# Patient Record
Sex: Male | Born: 1960 | Race: White | Hispanic: No | Marital: Single | Smoking: Never smoker
Health system: Southern US, Community
[De-identification: ages and names within clinical notes are randomized; demographics above are authoritative.]

## PROBLEM LIST (undated history)

## (undated) DIAGNOSIS — J45909 Unspecified asthma, uncomplicated: Secondary | ICD-10-CM

## (undated) HISTORY — DX: Unspecified asthma, uncomplicated: J45.909

---

## 1997-03-05 HISTORY — PX: BACK SURGERY: SHX140

## 1997-04-05 HISTORY — PX: LUNG REMOVAL, PARTIAL: SHX233

## 2000-10-15 ENCOUNTER — Encounter (INDEPENDENT_AMBULATORY_CARE_PROVIDER_SITE_OTHER): Payer: Self-pay | Admitting: *Deleted

## 2000-10-16 ENCOUNTER — Observation Stay (HOSPITAL_COMMUNITY): Admission: EM | Admit: 2000-10-16 | Discharge: 2000-10-17 | Payer: Self-pay

## 2001-04-05 HISTORY — PX: BACK SURGERY: SHX140

## 2002-10-06 HISTORY — PX: APPENDECTOMY: SHX54

## 2008-12-18 ENCOUNTER — Encounter: Admission: RE | Admit: 2008-12-18 | Discharge: 2008-12-18 | Payer: Self-pay | Admitting: Emergency Medicine

## 2011-01-21 NOTE — H&P (Signed)
[place]. [facility]  Patient:    Mason Rodriguez, Mason Rodriguez                       MRN: [id number] Adm. Date:  10/16/00 Attending:  Jimmye Norman, M.D.                         History and Physical  IDENTIFICATION AND CHIEF COMPLAINT:  The patient is a 50 year old gentleman with right lower quadrant pain, nausea, no vomiting, leukocytosis and CT diagnosed acute appendicitis.  HISTORY OF PRESENT ILLNESS:  The patient started getting ill yesterday morning with abdominal pain in the epigastrium and mid abdomen which slowly migrated to the right lower quadrant.  It was associated with some nausea.  No vomiting.  He had no fever or chills at home.  He came to the emergency room where his white count was noted to be 16,000.  A CT scan was done, in spite of having localized tenderness in the right lower quadrant in a young male and it showed acute appendicitis.  A surgical consultation was obtained.  PAST MEDICAL HISTORY:  Unremarkable.  He has had two previous back surgeries. He has no known drug allergies.  MEDICATIONS:  Occasional Vioxx.  REVIEW OF SYSTEMS:  He had no shortness of breath, chest pain, urinary symptoms, bladder symptoms, bowel problems.  His last bowel movement was yesterday.  His last meal was yesterday evening.  PHYSICAL EXAMINATION:  VITAL SIGNS:  He is afebrile.  His other vital signs are stable.  HEENT:  He is normocephalic, atraumatic and anicteric.  NECK:  Supple.  CHEST:  Clear.  CARDIAC:  Regular rate and rhythm.  ABDOMEN:  Soft and tender in the right lower quadrant with hypoactive bowel sounds no rebound or guarding.  RECTAL:  Examination no palpable mass.  Guaiac negative.  LABORATORY:  White count 16,700, hematocrit is over 40%.  Platelet count is normal.  Urinalysis is negative.  The 12 lead EKG is unremarkable.  IMPRESSION:  Likely acute appendicitis in an otherwise healthy 50 year old male.  PLAN:  Perform laparoscopic  possible open appendectomy if it should be ruptured.  No evidence of rupture on CT scan. DD:  10/16/00 TD:  10/16/00 Job: [id number]

## 2011-01-21 NOTE — Op Note (Signed)
[place]  Patient:    Mason Rodriguez, Mason Rodriguez                       MRN: [id number] Proc. Date: 10/16/00 Adm. Date:  10/16/00 Attending:  Jimmye Norman, M.D.                           Operative Report  PREOPERATIVE DIAGNOSIS:  Acute appendicitis.  POSTOPERATIVE DIAGNOSIS:  Acute appendicitis without perforation or abscess formation.  OPERATION PERFORMED:  Laparoscopic appendectomy.  SURGEON:  Jimmye Norman, M.D.  ASSISTANT:  None.  ANESTHESIA:  General endotracheal.  ESTIMATED BLOOD LOSS:  Less than 20 cc.  COMPLICATIONS:  None.  CONDITION:  Stable.  INDICATIONS FOR PROCEDURE:  The patient is an otherwise healthy 50 year old male with abdominal pain starting in the epigastrium and midabdomen, migrating to the right lower quadrant.  Clinically significant for acute appendicitis with a white count of 16,700.  He did receive a CT scan which confirmed the clinical impression of acute appendicitis.  OPERATIVE FINDINGS:  The patient had an acutely inflamed retrocecal appendix which was not perforated.  DESCRIPTION OF PROCEDURE:  The patient was taken to the operating room and placed on the table in the supine position.  After an adequate general anesthetic was administered, he was prepped and draped in the usual sterile manner exposing the midline and the right side of the abdomen.  Initially, a superumbilical curvilinear incision was made using a #11 blade and taken down to the midline fascia through which a ____________ was passed into the peritoneal cavity while tenting up on the anterior abdominal wall using sharp towel clamps.  The Veress needle was confirmed to be in position using the saline test and subsequently carbon dioxide insufflation was instilled through the Veress needle into the peritoneal cavity up to a maximal intra-abdominal pressure of 15 mmHg.  Once this was done, the 10-11 mm trocar and cannula was passed through the  superumbilical fascia into the peritoneal cavity and confirmed to be in adequate position using laparoscope with attached camera and light source.  The patient was placed in Trendelenburg position.  The left side was tilted down.  The acutely inflamed appendix could be identified.  We placed a right upper quadrant 5 mm cannula and a superpubic 11-12 mm cannula into the peritoneal cavity under direct vision.  With that in place, we subsequently were able to perform the operation.  The peritoneum attaching the appendix laterally along with the inflammatory adhesions were taken down bluntly.  We were able to suspend the acutely inflamed appendix which was not perforated and dissect between the mesoappendix and the base of the appendix at the cecum.  It was through this window that a Endo GIA with 3.5 mm staples was passed along the base of the appendix and stapled across detaching it from the base of the cecum.  We subsequently passed a 2.5 mm stapler across the medial appendix detaching it with minimal bleeding.  There was some left that needed to be cauterized along the mesentery and subsequently no further bleeding.  Once the appendix was completely detached, we brought it out through the cannula at the superpubic site without contaminating the subcu.  We subsequently irrigated with a half liter of warm saline and then we allowed all gas to escape through the cannulas, removed the instruments and then closed the abdomen.  The superumbilical and the superpubic fascia were closed  using 0 Vicryl.  At the superumbilical site a figure-of-eight stitch was used and a simple stitch was used at the superpubic site.  0.25% Marcaine with epinephrine was injected at all the incision sites and the skin was closed using a running subcuticular 4-0 Vicryl.  Sponge, needle and instrument counts were correct at conclusion of the case.  Sterile dressings were applied to all wounds. DD:  10/16/00 TD:   10/16/00 Job: [id number]

## 2012-03-29 ENCOUNTER — Other Ambulatory Visit: Payer: Self-pay | Admitting: Family Medicine

## 2012-03-29 DIAGNOSIS — R109 Unspecified abdominal pain: Secondary | ICD-10-CM

## 2012-04-02 ENCOUNTER — Ambulatory Visit
Admission: RE | Admit: 2012-04-02 | Discharge: 2012-04-02 | Disposition: A | Discharge status: Home / Self Care | Payer: BC Managed Care – PPO | Source: Ambulatory Visit | Attending: Family Medicine | Admitting: Family Medicine

## 2012-04-02 DIAGNOSIS — R109 Unspecified abdominal pain: Secondary | ICD-10-CM

## 2012-04-09 ENCOUNTER — Other Ambulatory Visit: Payer: Self-pay

## 2013-02-13 ENCOUNTER — Other Ambulatory Visit: Payer: Self-pay | Admitting: Dermatology

## 2015-12-14 DIAGNOSIS — L723 Sebaceous cyst: Secondary | ICD-10-CM | POA: Diagnosis not present

## 2015-12-14 DIAGNOSIS — L821 Other seborrheic keratosis: Secondary | ICD-10-CM | POA: Diagnosis not present

## 2015-12-14 DIAGNOSIS — Z85828 Personal history of other malignant neoplasm of skin: Secondary | ICD-10-CM | POA: Diagnosis not present

## 2015-12-14 DIAGNOSIS — L438 Other lichen planus: Secondary | ICD-10-CM | POA: Diagnosis not present

## 2015-12-14 DIAGNOSIS — L570 Actinic keratosis: Secondary | ICD-10-CM | POA: Diagnosis not present

## 2016-01-14 DIAGNOSIS — Z85828 Personal history of other malignant neoplasm of skin: Secondary | ICD-10-CM | POA: Diagnosis not present

## 2016-01-14 DIAGNOSIS — C44519 Basal cell carcinoma of skin of other part of trunk: Secondary | ICD-10-CM | POA: Diagnosis not present

## 2016-01-14 DIAGNOSIS — C44619 Basal cell carcinoma of skin of left upper limb, including shoulder: Secondary | ICD-10-CM | POA: Diagnosis not present

## 2016-01-14 DIAGNOSIS — L821 Other seborrheic keratosis: Secondary | ICD-10-CM | POA: Diagnosis not present

## 2016-01-14 DIAGNOSIS — L918 Other hypertrophic disorders of the skin: Secondary | ICD-10-CM | POA: Diagnosis not present

## 2016-01-14 DIAGNOSIS — D225 Melanocytic nevi of trunk: Secondary | ICD-10-CM | POA: Diagnosis not present

## 2016-07-11 DIAGNOSIS — E7800 Pure hypercholesterolemia, unspecified: Secondary | ICD-10-CM | POA: Diagnosis not present

## 2016-07-11 DIAGNOSIS — M62830 Muscle spasm of back: Secondary | ICD-10-CM | POA: Diagnosis not present

## 2016-07-11 DIAGNOSIS — J4520 Mild intermittent asthma, uncomplicated: Secondary | ICD-10-CM | POA: Diagnosis not present

## 2016-07-11 DIAGNOSIS — Z0000 Encounter for general adult medical examination without abnormal findings: Secondary | ICD-10-CM | POA: Diagnosis not present

## 2016-07-11 DIAGNOSIS — I10 Essential (primary) hypertension: Secondary | ICD-10-CM | POA: Diagnosis not present

## 2016-07-11 DIAGNOSIS — Z125 Encounter for screening for malignant neoplasm of prostate: Secondary | ICD-10-CM | POA: Diagnosis not present

## 2016-08-05 DIAGNOSIS — J4520 Mild intermittent asthma, uncomplicated: Secondary | ICD-10-CM | POA: Diagnosis not present

## 2016-08-05 DIAGNOSIS — J069 Acute upper respiratory infection, unspecified: Secondary | ICD-10-CM | POA: Diagnosis not present

## 2016-09-07 ENCOUNTER — Encounter (INDEPENDENT_AMBULATORY_CARE_PROVIDER_SITE_OTHER): Payer: Self-pay

## 2016-09-07 ENCOUNTER — Ambulatory Visit (INDEPENDENT_AMBULATORY_CARE_PROVIDER_SITE_OTHER): Payer: BLUE CROSS/BLUE SHIELD | Admitting: Allergy & Immunology

## 2016-09-07 ENCOUNTER — Encounter: Payer: Self-pay | Admitting: Allergy & Immunology

## 2016-09-07 VITALS — BP 138/88 | HR 61 | Temp 97.8°F | Resp 18 | Ht 69.75 in | Wt 213.2 lb

## 2016-09-07 DIAGNOSIS — J3089 Other allergic rhinitis: Secondary | ICD-10-CM

## 2016-09-07 DIAGNOSIS — T781XXD Other adverse food reactions, not elsewhere classified, subsequent encounter: Secondary | ICD-10-CM

## 2016-09-07 DIAGNOSIS — J4530 Mild persistent asthma, uncomplicated: Secondary | ICD-10-CM | POA: Diagnosis not present

## 2016-09-07 NOTE — Progress Notes (Addendum)
NEW PATIENT  Date of Service/Encounter:  09/07/16   Assessment:   Mild persistent asthma, uncomplicated  Chronic nonseasonal allergic rhinitis due to pollen  Adverse food reactions   Asthma Reportables:  Severity: mild persistent  Risk: low Control: not well controlled  Seasonal Influenza Vaccine: yes    Plan/Recommendations:    1. Mild persistent asthma, uncomplicated - Lung function was normal today with reversibility. - Despite the fact that the reversibility was not significant, it would have been difficult to get enough of an improvement from his baseline normal levels. - Given the reversibility and the somewhat vague symptoms, I feel that it would be worthwhile to trial a dose of ICS to see if there is improvement. - Sample of Arnuity one inhalation daily provided.  - I asked Mason Rodriguez to call us to let us know if this helps your respiratory symptoms and we can send it in.   2. Chronic rhinitis - Testing was positive to grasses, weeds, trees, molds, cats, horses, dust mite, and cockroach - Mason Rodriguez will give [place] a call when he makes decision about immunotherapy. - Start Flonase 2 sprays per nostril daily. - Use Xyzal 5 mg daily as needed for breakthrough symptoms. - Samples provided.   3. Adverse food reactions - Testing was negative today. - There is an excellent negative predictive value, therefore these are likely true negatives. - Deferred blood testing since the symptoms were rather vague.   4. Return in about 3 months (around 12/06/2016).   Subjective:   Mason Rodriguez is a 56 y.o. male presenting today for evaluation of  Chief Complaint  Patient presents with  . New Evaluation    allergy induced asthma  . Allergy Testing    has not had any since he as 18  . Asthma    having off and on problems since November (wheezing, hard time breathing    Mason Rodriguez has a history of the following: Patient Active Problem List   Diagnosis Date Noted  . Chronic nonseasonal allergic rhinitis due to pollen 09/07/2016  . Mild persistent asthma, uncomplicated 09/07/2016    History obtained from: chart review and patient.  Mason Rodriguez was referred by Darrow Bussing, MD.     Mason Rodriguez is a 56 y.o. male presenting for an allergy and asthma evaluation. 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 He was first diagnosed when he was around age 15, including two hospitalizations. He was never intubated. He was on asthma medications when he was a kid although he cannot remember the names of the medications. He was on allergy shots for three years, managed by his father who was a Tour manager. He was skin testing done at his pediatricians and then his father adminsitered the allergy shots. He estimates that he stopped around age 46. He does think that they helped at that time but he only continued them for two years or so. He knows he was allergic to cats and horses. Despite the reported improvement in his symptoms, they did actually cause him to make career decisions. He was actually admitted to vet school but did not go because of his allergies. 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 He was tested again when he was around age 22 when he signed up to be in an allergy study; testing was positive to cats, horses, molds, and trees.   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 This was last done in November 2017. Cats continue to be a trigger so when he was visiting his son, it worsened. He has albuterol inhaler which he uses as needed. He also takes Claritin and alternates with Zyrtec. He never had much success with nose sprays although he is unsure that he ever gave them a good chance. Symptoms usually resolved in the cooler months but  overall this year has been worse despite the cooler weather. He did have a flare when he started his heater this season and he is now planning to have his HVAC system completely replaced, including ductwork.  Mason Rodriguez has no history of food allergies although he is not entirely sure. With the worsening of his allergic rhinitis symptoms, he is wondering whether foods might be contributing to this. 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 Otherwise he has no history of infections. Otherwise, there is no history of other atopic diseases, including food allergies, stinging insect allergies, or urticaria. Vaccinations are up to date.    Past Medical History: Patient Active Problem List   Diagnosis Date Noted  . Chronic nonseasonal allergic rhinitis due to pollen 09/07/2016  . Mild persistent asthma, uncomplicated 09/07/2016    Medication List:  Allergies as of 09/07/2016   Not on File     Medication List       Accurate as of 09/07/16  9:51 PM. Always use your most recent med list.          hydrochlorothiazide 12.5 MG tablet Commonly known as:  HYDRODIURIL Take 12.5 mg by mouth daily.   PROAIR HFA 108 (90 Base) MCG/ACT inhaler Generic drug:  albuterol Inhale 90 mcg into the lungs as needed.       Birth History: non-contributory.   Developmental History: non-contributory.   Past Surgical History: Past Surgical History:  Procedure Laterality Date  . APPENDECTOMY  10/2002  . BACK SURGERY  03/1997   L3/L4  . BACK SURGERY  04/2001   L4/L5  . LUNG REMOVAL, PARTIAL  04/1997   upper lobe     Family History: Family History  Problem Relation Age of Onset  . Asthma Mother   . Urticaria Sister   . Allergic rhinitis Neg Hx   . Angioedema Neg Hx   . Eczema Neg Hx   . Immunodeficiency Neg Hx       Social History: Mason Rodriguez lives at home with his family. He lives in a house built in 2001. There is carpeting within the main living area as well as carpeting in the bedrooms. They have a combination of electric and gas heating. They have central cooling. There is one dog inside the home. There are no roach or mildew issues. They do not have dust mite coverings on their bedding. There is no tobacco smoke exposure. 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 However this is not the majority of his job.   Review of Systems: a 14-point review of systems is pertinent for what is mentioned in HPI.  Otherwise, all other systems were negative. Constitutional: negative other than that listed in the HPI Eyes: negative other than that listed in the HPI Ears, nose, mouth,  throat, and face: negative other than that listed in the HPI Respiratory: negative other than that listed in the HPI Cardiovascular: negative other than that listed in the HPI Gastrointestinal: negative other than that listed in the HPI Genitourinary: negative other than that listed in the HPI Integument: negative other than that listed in the HPI Hematologic: negative other than that listed in the HPI Musculoskeletal: negative other than that listed in the HPI Neurological: negative other than that listed in the HPI Allergy/Immunologic: negative other than that listed in the HPI    Objective:   Blood pressure 138/88, pulse 61, temperature 97.8 F (36.6 C), temperature source Oral, resp. rate 18, height 5' 9.75" (1.772 m), weight 213 lb 3.2 oz (96.7 kg), SpO2 98 %. Body mass index is 30.81 kg/m.   Physical Exam:  General: Alert, interactive, in no acute distress. Cooperative with the exam. Pleasant.  Eyes: No conjunctival injection present on the right, No conjunctival injection present on the left, PERRL bilaterally, No discharge  on the right, No discharge on the left and No Horner-Trantas dots present Ears: Right TM pearly gray with normal light reflex, Left TM pearly gray with normal light reflex, Right TM intact without perforation and Left TM intact without perforation.  Nose/Throat: nasal crease present and septum midline, turbinates markedly edematous and pale with clear discharge, post-pharynx erythematous with cobblestoning in the posterior oropharynx. Tonsils 3+ without exudates Neck: Supple without thyromegaly. Adenopathy: no enlarged lymph nodes appreciated in the anterior cervical, occipital, axillary, epitrochlear, inguinal, or popliteal regions Lungs: Decreased breath sounds bilaterally without wheezing, rhonchi or rales. No increased work of breathing. CV: Normal S1/S2, no murmurs. Capillary refill <2 seconds.  Abdomen: Nondistended, nontender. No guarding or rebound tenderness. Bowel sounds present in all fields and hyperactive  Skin: Warm and dry, without lesions or rashes. Extremities:  No clubbing, cyanosis or edema. Neuro:   Grossly intact. No focal deficits appreciated. Responsive to questions.  Diagnostic studies:  Spirometry: results normal (FEV1: 3.42/94%, FVC: 4.50/99%, FEV1/FVC: 76%).    Spirometry consistent with normal pattern. Albuterol nebulizer treatment given in clinic with improvement in the FEV1 (11%) and the FVC (10%). This improvement did not meet ATS criteria, however he started at a normal spirometry this would have been a difficult bar to meet.   Allergy Studies:   Indoor/Outdoor Percutaneous Adult Environmental Panel: positive to bahia grass, [place] grass, johnson grass, [place] blue grass, meadow fescue grass, perennial rye grass, sweet vernal grass, timothy grass, short ragweed, English plantain, sheep sorrel, ash, birch, American beech, oak, pecan pollen, Alternaria, Df mite, Dp mites, cat and horse. Otherwise negative with adequate controls.  Indoor/Outdoor Selected  Intradermal Environmental Panel: positive to mold mix #4 and cockroach. Otherwise negative with adequate controls.  Most Common Foods Panel (peanut, tree nut, soy, fish mix, shellfish mix, wheat, milk, egg): Negative to all with adequate controls     Malachi BondsJoel Laquincy Eastridge, MD [facility] [place]

## 2016-09-07 NOTE — Patient Instructions (Addendum)
1. Mild persistent asthma, uncomplicated - Lung function was normal today. - Since you had reversibility today, I would recommend starting an inhaled steroid to see if you have improved respiratory symptoms: Arnuity 100mcg one inhalation daily. - Call us to let us know if this helps your respiratory symptoms and we can send it in.   2. Chronic rhinitis - Testing was positive to grasses, weeds, trees, molds, cats, horses, dust mite, and cockroach - Call us when he make decision about immunotherapy. - Start Flonase 2 sprays per nostril daily. - Use Xyzal 5 mg daily as needed for breakthrough symptoms.  3. Return in about 3 months (around 12/06/2016).  Please inform us of any Emergency Department visits, hospitalizations, or changes in symptoms. Call us before going to the ED for breathing or allergy symptoms since we might be able to fit you in for a sick visit. Feel free to contact us anytime with any questions, problems, or concerns.  It was a pleasure to meet you today! Best wishes in the [place] Year!   Websites that have reliable patient information: 1. American Academy of Asthma, Allergy, and Immunology: www.aaaai.org 2. Food Allergy Research and Education (FARE): foodallergy.org 3. Mothers of Asthmatics: http://www.asthmacommunitynetwork.org 4. American College of Allergy, Asthma, and Immunology: www.acaai.org

## 2016-11-11 ENCOUNTER — Other Ambulatory Visit: Payer: Self-pay | Admitting: Allergy & Immunology

## 2016-11-11 MED ORDER — FLUTICASONE FUROATE 100 MCG/ACT IN AEPB: 1.0000 | INHALATION_SPRAY | Freq: Every day | RESPIRATORY_TRACT | 5 refills | Status: AC

## 2016-11-11 NOTE — Telephone Encounter (Signed)
Patient saw Dr. Dellis AnesGallagher, 09-07-16, and was given a sample of Arnuity to see if it helped him. It did and he would like a prescription called in to [facility] [place].

## 2016-11-11 NOTE — Telephone Encounter (Signed)
Called patient, left message informing patient that I sent script for Arnuity 100 1 puff [facility] [place]

## 2016-12-22 DIAGNOSIS — M5432 Sciatica, left side: Secondary | ICD-10-CM | POA: Diagnosis not present

## 2017-01-12 DIAGNOSIS — M5416 Radiculopathy, lumbar region: Secondary | ICD-10-CM | POA: Diagnosis not present

## 2017-01-16 DIAGNOSIS — B078 Other viral warts: Secondary | ICD-10-CM | POA: Diagnosis not present

## 2017-01-16 DIAGNOSIS — L570 Actinic keratosis: Secondary | ICD-10-CM | POA: Diagnosis not present

## 2017-01-16 DIAGNOSIS — L821 Other seborrheic keratosis: Secondary | ICD-10-CM | POA: Diagnosis not present

## 2017-01-16 DIAGNOSIS — Z85828 Personal history of other malignant neoplasm of skin: Secondary | ICD-10-CM | POA: Diagnosis not present

## 2017-01-17 DIAGNOSIS — E7800 Pure hypercholesterolemia, unspecified: Secondary | ICD-10-CM | POA: Diagnosis not present

## 2017-01-24 DIAGNOSIS — M5416 Radiculopathy, lumbar region: Secondary | ICD-10-CM | POA: Diagnosis not present

## 2017-01-24 DIAGNOSIS — M48061 Spinal stenosis, lumbar region without neurogenic claudication: Secondary | ICD-10-CM | POA: Diagnosis not present

## 2017-01-24 DIAGNOSIS — M5127 Other intervertebral disc displacement, lumbosacral region: Secondary | ICD-10-CM | POA: Diagnosis not present

## 2017-01-24 DIAGNOSIS — M47816 Spondylosis without myelopathy or radiculopathy, lumbar region: Secondary | ICD-10-CM | POA: Diagnosis not present

## 2017-01-26 DIAGNOSIS — M5127 Other intervertebral disc displacement, lumbosacral region: Secondary | ICD-10-CM | POA: Diagnosis not present

## 2017-06-09 DIAGNOSIS — J069 Acute upper respiratory infection, unspecified: Secondary | ICD-10-CM | POA: Diagnosis not present

## 2017-06-09 DIAGNOSIS — R062 Wheezing: Secondary | ICD-10-CM | POA: Diagnosis not present

## 2017-08-03 DIAGNOSIS — L570 Actinic keratosis: Secondary | ICD-10-CM | POA: Diagnosis not present

## 2017-08-03 DIAGNOSIS — L821 Other seborrheic keratosis: Secondary | ICD-10-CM | POA: Diagnosis not present

## 2017-08-03 DIAGNOSIS — Z85828 Personal history of other malignant neoplasm of skin: Secondary | ICD-10-CM | POA: Diagnosis not present

## 2017-09-29 DIAGNOSIS — J4520 Mild intermittent asthma, uncomplicated: Secondary | ICD-10-CM | POA: Diagnosis not present

## 2017-09-29 DIAGNOSIS — Z0000 Encounter for general adult medical examination without abnormal findings: Secondary | ICD-10-CM | POA: Diagnosis not present

## 2017-09-29 DIAGNOSIS — Z125 Encounter for screening for malignant neoplasm of prostate: Secondary | ICD-10-CM | POA: Diagnosis not present

## 2017-09-29 DIAGNOSIS — N529 Male erectile dysfunction, unspecified: Secondary | ICD-10-CM | POA: Diagnosis not present

## 2017-09-29 DIAGNOSIS — I10 Essential (primary) hypertension: Secondary | ICD-10-CM | POA: Diagnosis not present

## 2017-09-29 DIAGNOSIS — E7800 Pure hypercholesterolemia, unspecified: Secondary | ICD-10-CM | POA: Diagnosis not present

## 2017-10-20 DIAGNOSIS — J309 Allergic rhinitis, unspecified: Secondary | ICD-10-CM | POA: Diagnosis not present

## 2017-10-20 DIAGNOSIS — H1010 Acute atopic conjunctivitis, unspecified eye: Secondary | ICD-10-CM | POA: Diagnosis not present

## 2018-07-31 DIAGNOSIS — Z23 Encounter for immunization: Secondary | ICD-10-CM | POA: Diagnosis not present

## 2018-08-08 DIAGNOSIS — D225 Melanocytic nevi of trunk: Secondary | ICD-10-CM | POA: Diagnosis not present

## 2018-08-08 DIAGNOSIS — L570 Actinic keratosis: Secondary | ICD-10-CM | POA: Diagnosis not present

## 2018-08-08 DIAGNOSIS — L814 Other melanin hyperpigmentation: Secondary | ICD-10-CM | POA: Diagnosis not present

## 2018-08-08 DIAGNOSIS — L821 Other seborrheic keratosis: Secondary | ICD-10-CM | POA: Diagnosis not present

## 2018-08-08 DIAGNOSIS — Z85828 Personal history of other malignant neoplasm of skin: Secondary | ICD-10-CM | POA: Diagnosis not present

## 2018-10-10 DIAGNOSIS — E7800 Pure hypercholesterolemia, unspecified: Secondary | ICD-10-CM | POA: Diagnosis not present

## 2018-10-10 DIAGNOSIS — Z125 Encounter for screening for malignant neoplasm of prostate: Secondary | ICD-10-CM | POA: Diagnosis not present

## 2018-10-10 DIAGNOSIS — Z0000 Encounter for general adult medical examination without abnormal findings: Secondary | ICD-10-CM | POA: Diagnosis not present

## 2019-07-22 DIAGNOSIS — M5440 Lumbago with sciatica, unspecified side: Secondary | ICD-10-CM | POA: Diagnosis not present

## 2019-07-25 DIAGNOSIS — M5432 Sciatica, left side: Secondary | ICD-10-CM | POA: Diagnosis not present

## 2019-07-26 DIAGNOSIS — Z8739 Personal history of other diseases of the musculoskeletal system and connective tissue: Secondary | ICD-10-CM | POA: Diagnosis not present

## 2019-07-26 DIAGNOSIS — M5440 Lumbago with sciatica, unspecified side: Secondary | ICD-10-CM | POA: Diagnosis not present

## 2019-07-29 DIAGNOSIS — M5432 Sciatica, left side: Secondary | ICD-10-CM | POA: Diagnosis not present

## 2019-07-31 DIAGNOSIS — M5432 Sciatica, left side: Secondary | ICD-10-CM | POA: Diagnosis not present

## 2019-08-12 ENCOUNTER — Other Ambulatory Visit: Payer: Self-pay | Admitting: Family Medicine

## 2019-08-12 DIAGNOSIS — M5432 Sciatica, left side: Secondary | ICD-10-CM | POA: Diagnosis not present

## 2019-08-12 DIAGNOSIS — Z8739 Personal history of other diseases of the musculoskeletal system and connective tissue: Secondary | ICD-10-CM

## 2019-08-12 DIAGNOSIS — M5440 Lumbago with sciatica, unspecified side: Secondary | ICD-10-CM

## 2019-08-13 DIAGNOSIS — L570 Actinic keratosis: Secondary | ICD-10-CM | POA: Diagnosis not present

## 2019-08-13 DIAGNOSIS — D225 Melanocytic nevi of trunk: Secondary | ICD-10-CM | POA: Diagnosis not present

## 2019-08-13 DIAGNOSIS — Z85828 Personal history of other malignant neoplasm of skin: Secondary | ICD-10-CM | POA: Diagnosis not present

## 2019-08-13 DIAGNOSIS — D2262 Melanocytic nevi of left upper limb, including shoulder: Secondary | ICD-10-CM | POA: Diagnosis not present

## 2019-08-13 DIAGNOSIS — L821 Other seborrheic keratosis: Secondary | ICD-10-CM | POA: Diagnosis not present

## 2019-08-16 ENCOUNTER — Other Ambulatory Visit: Payer: Self-pay

## 2019-08-16 ENCOUNTER — Ambulatory Visit
Admission: RE | Admit: 2019-08-16 | Discharge: 2019-08-16 | Disposition: A | Discharge status: Home / Self Care | Payer: BLUE CROSS/BLUE SHIELD | Source: Ambulatory Visit | Attending: Family Medicine | Admitting: Family Medicine

## 2019-08-16 DIAGNOSIS — M48061 Spinal stenosis, lumbar region without neurogenic claudication: Secondary | ICD-10-CM | POA: Diagnosis not present

## 2019-08-16 DIAGNOSIS — Z8739 Personal history of other diseases of the musculoskeletal system and connective tissue: Secondary | ICD-10-CM

## 2019-08-16 DIAGNOSIS — M5440 Lumbago with sciatica, unspecified side: Secondary | ICD-10-CM

## 2019-08-21 DIAGNOSIS — M5126 Other intervertebral disc displacement, lumbar region: Secondary | ICD-10-CM | POA: Diagnosis not present

## 2019-08-28 DIAGNOSIS — Z01818 Encounter for other preprocedural examination: Secondary | ICD-10-CM | POA: Diagnosis not present

## 2019-09-03 DIAGNOSIS — M5116 Intervertebral disc disorders with radiculopathy, lumbar region: Secondary | ICD-10-CM | POA: Diagnosis not present

## 2019-09-03 DIAGNOSIS — M5126 Other intervertebral disc displacement, lumbar region: Secondary | ICD-10-CM | POA: Diagnosis not present

## 2019-09-16 ENCOUNTER — Telehealth (HOSPITAL_COMMUNITY): Payer: Self-pay

## 2019-09-16 NOTE — Telephone Encounter (Signed)
  Received a referral from [facility] called patient he refused to schedule ultrasound, he told me he spoke with the nurse and it was not nessicary for him to have the ultrasound, I called and spoke with referral coordinator Aram Beecham and informed her of the events that had took place. She informed me to shred the order and she was sending a note to the doctor.   [employer]

## 2019-10-28 DIAGNOSIS — Z0000 Encounter for general adult medical examination without abnormal findings: Secondary | ICD-10-CM | POA: Diagnosis not present

## 2019-10-31 DIAGNOSIS — Z23 Encounter for immunization: Secondary | ICD-10-CM | POA: Diagnosis not present

## 2019-11-12 DIAGNOSIS — Z131 Encounter for screening for diabetes mellitus: Secondary | ICD-10-CM | POA: Diagnosis not present

## 2019-11-12 DIAGNOSIS — Z13220 Encounter for screening for lipoid disorders: Secondary | ICD-10-CM | POA: Diagnosis not present

## 2019-11-12 DIAGNOSIS — Z125 Encounter for screening for malignant neoplasm of prostate: Secondary | ICD-10-CM | POA: Diagnosis not present

## 2020-01-06 DIAGNOSIS — Z23 Encounter for immunization: Secondary | ICD-10-CM | POA: Diagnosis not present

## 2020-03-05 DIAGNOSIS — Z20822 Contact with and (suspected) exposure to covid-19: Secondary | ICD-10-CM | POA: Diagnosis not present

## 2020-03-05 DIAGNOSIS — Z03818 Encounter for observation for suspected exposure to other biological agents ruled out: Secondary | ICD-10-CM | POA: Diagnosis not present

## 2020-04-22 DIAGNOSIS — Z20828 Contact with and (suspected) exposure to other viral communicable diseases: Secondary | ICD-10-CM | POA: Diagnosis not present

## 2020-05-19 DIAGNOSIS — E7800 Pure hypercholesterolemia, unspecified: Secondary | ICD-10-CM | POA: Diagnosis not present

## 2020-05-19 DIAGNOSIS — I10 Essential (primary) hypertension: Secondary | ICD-10-CM | POA: Diagnosis not present

## 2020-08-17 DIAGNOSIS — L814 Other melanin hyperpigmentation: Secondary | ICD-10-CM | POA: Diagnosis not present

## 2020-08-17 DIAGNOSIS — C44612 Basal cell carcinoma of skin of right upper limb, including shoulder: Secondary | ICD-10-CM | POA: Diagnosis not present

## 2020-08-17 DIAGNOSIS — Z85828 Personal history of other malignant neoplasm of skin: Secondary | ICD-10-CM | POA: Diagnosis not present

## 2020-08-17 DIAGNOSIS — L821 Other seborrheic keratosis: Secondary | ICD-10-CM | POA: Diagnosis not present

## 2020-08-17 DIAGNOSIS — L570 Actinic keratosis: Secondary | ICD-10-CM | POA: Diagnosis not present

## 2020-10-28 DIAGNOSIS — Z23 Encounter for immunization: Secondary | ICD-10-CM | POA: Diagnosis not present

## 2020-10-28 DIAGNOSIS — I10 Essential (primary) hypertension: Secondary | ICD-10-CM | POA: Diagnosis not present

## 2020-10-28 DIAGNOSIS — Z125 Encounter for screening for malignant neoplasm of prostate: Secondary | ICD-10-CM | POA: Diagnosis not present

## 2020-10-28 DIAGNOSIS — Z0000 Encounter for general adult medical examination without abnormal findings: Secondary | ICD-10-CM | POA: Diagnosis not present

## 2020-10-28 DIAGNOSIS — E7800 Pure hypercholesterolemia, unspecified: Secondary | ICD-10-CM | POA: Diagnosis not present

## 2020-10-28 DIAGNOSIS — Z131 Encounter for screening for diabetes mellitus: Secondary | ICD-10-CM | POA: Diagnosis not present

## 2020-11-09 DIAGNOSIS — Z01812 Encounter for preprocedural laboratory examination: Secondary | ICD-10-CM | POA: Diagnosis not present

## 2020-11-12 DIAGNOSIS — K640 First degree hemorrhoids: Secondary | ICD-10-CM | POA: Diagnosis not present

## 2020-11-12 DIAGNOSIS — Z1211 Encounter for screening for malignant neoplasm of colon: Secondary | ICD-10-CM | POA: Diagnosis not present

## 2020-12-08 IMAGING — MR MR LUMBAR SPINE W/O CM
4 of 5 series · 17 of 48 positions shown · non-contrast
Comparison: [HOSPITAL] neurosurgery lumbar MRI 01/24/2017.

CLINICAL DATA: 58-year-old male with low back pain, radiating to
the left leg and foot. Fall in [REDACTED]. Prior back surgery.

EXAM:
MRI LUMBAR SPINE WITHOUT CONTRAST
TECHNIQUE: Multiplanar, multisequence MR imaging of the lumbar spine was
performed. No intravenous contrast was administered.

[Series 10: T2 · sagittal · 4.0mm · 0.73mm/px · 6 of 15 slices shown (1 of 2)]
[im 1/15]
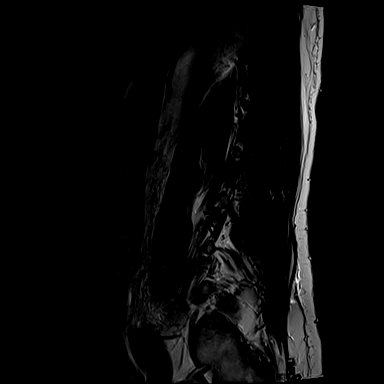
[im 3/15]
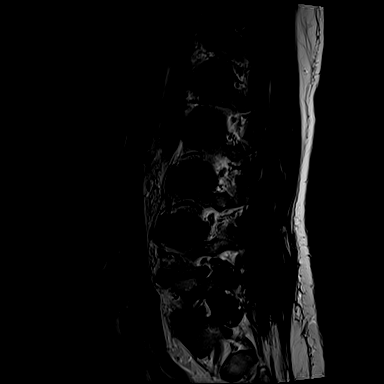
[im 6/15]
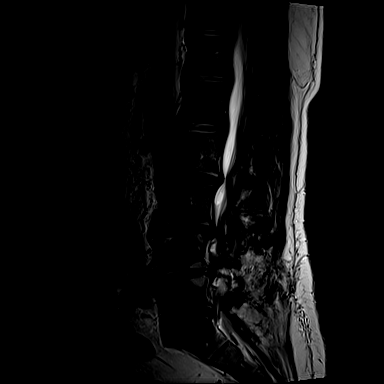
[im 9/15]
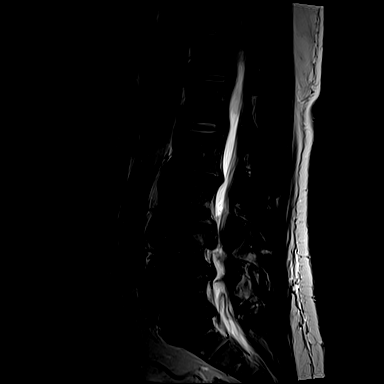
[im 12/15]
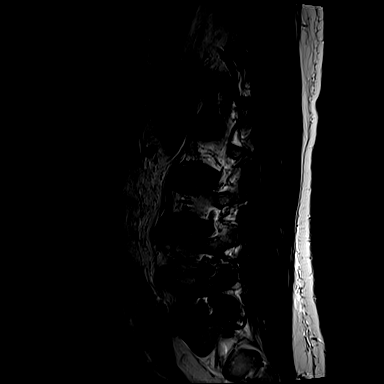
[im 15/15]
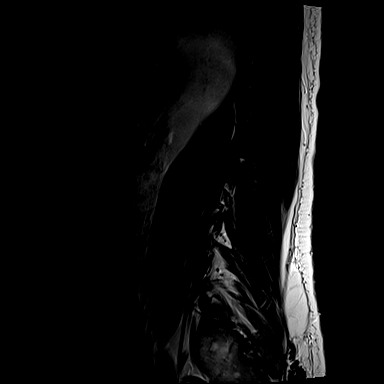

[Series 11: T1 · sagittal · 4.0mm · 0.73mm/px · 3 of 15 slices shown (1 of 2)]
[im 3/15]
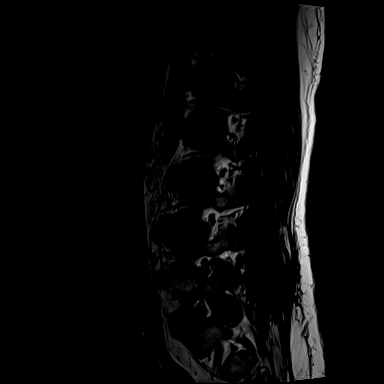
[im 9/15]
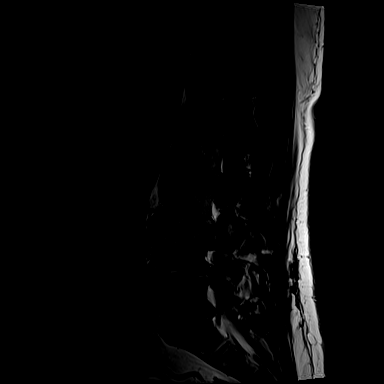
[im 15/15]
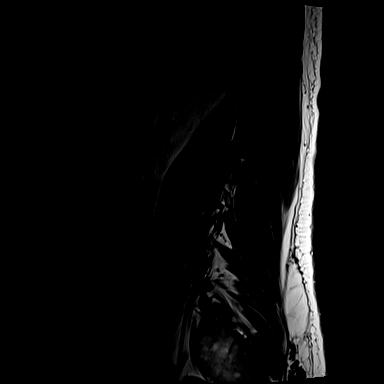

[Series 15: T1 · axial · 4.0mm · 0.28mm/px · z∈[-60,+97]mm · 3 of 39 slices shown (2 of 2)]
[im 6/39]
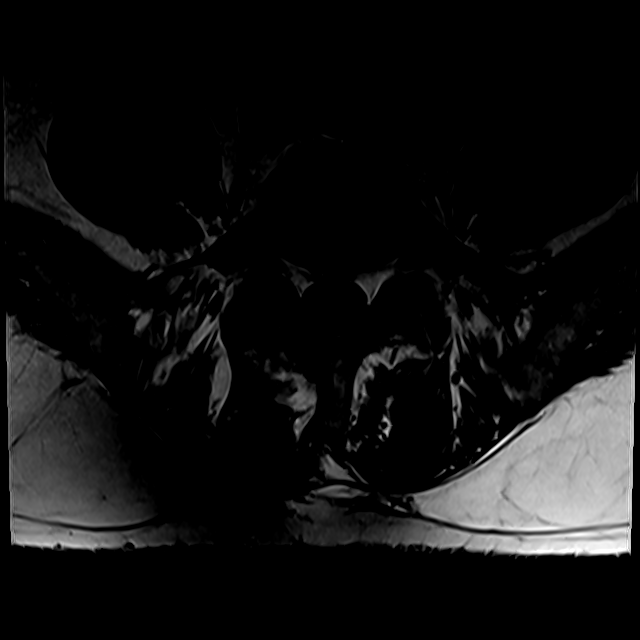
[im 20/39]
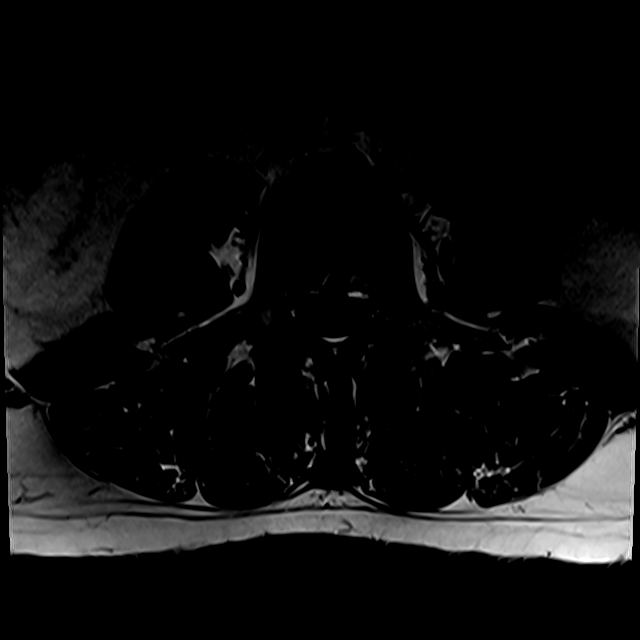
[im 33/39]
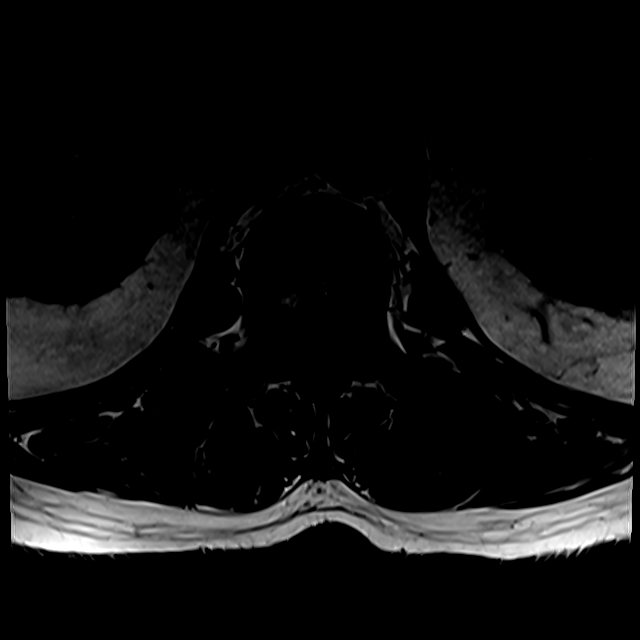

[Series 18: T2 · axial · 4.0mm · 0.28mm/px · z∈[-85,+97]mm · 5 of 39 slices shown (2 of 2)]
[im 1/39]
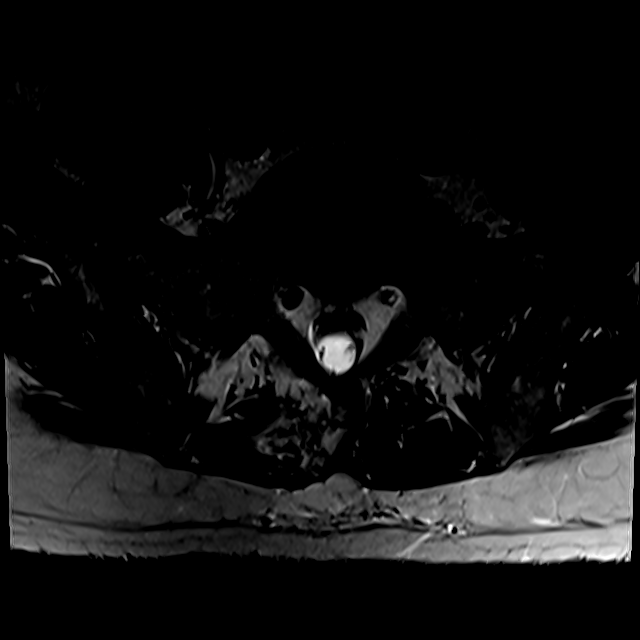
[im 6/39]
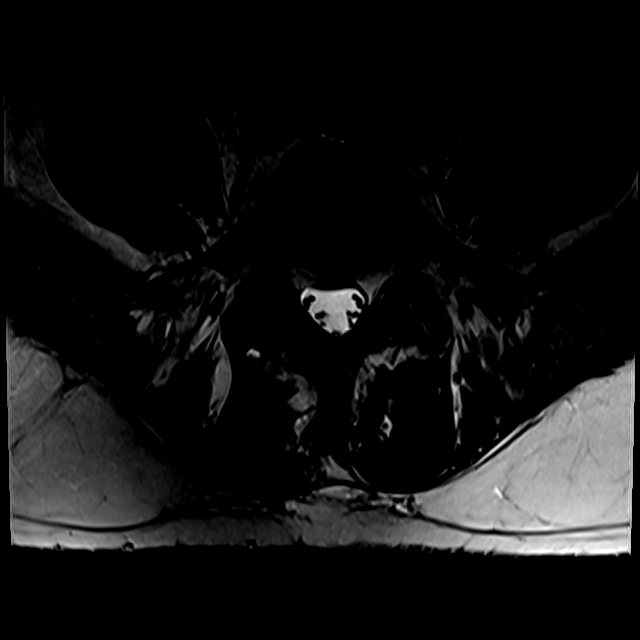
[im 11/39]
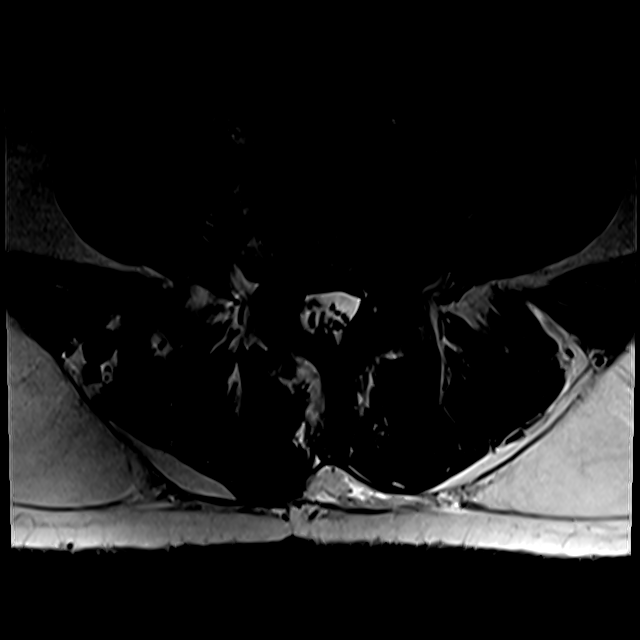
[im 20/39]
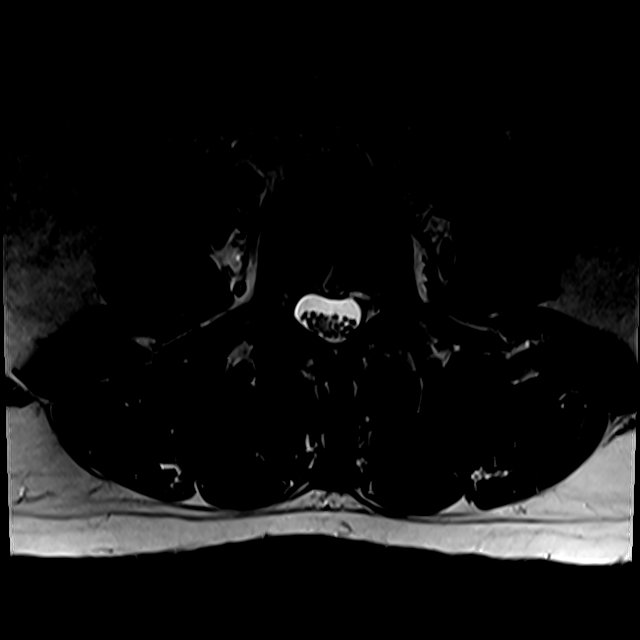
[im 33/39]
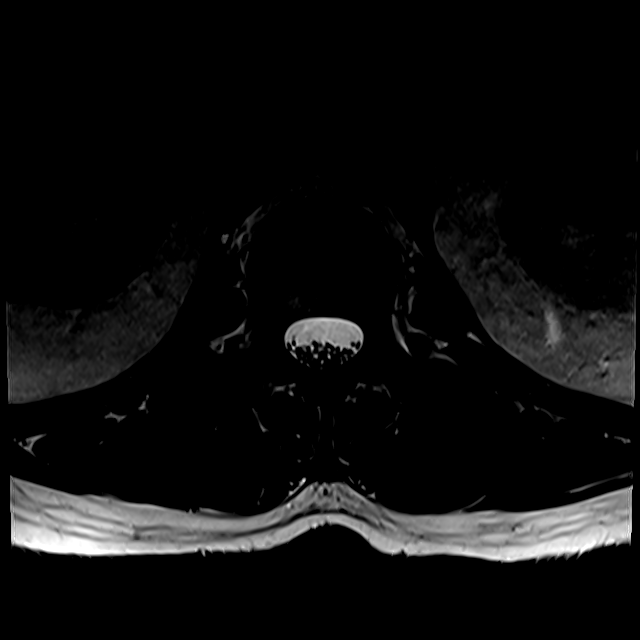

[17 of 48 positions shown; findings below may reference images not displayed]

FINDINGS: Segmentation: Lumbar segmentation appears to be normal and is the
same numbering system used in 5038.

Alignment: Chronic straightening of lumbar lordosis. Mild
retrolisthesis of L3 on L4 has progressed.

Vertebrae: Degenerative appearing posterior endplate marrow edema at
L3-L4, see additional details below. Chronic degenerative endplate
marrow signal changes elsewhere in the lower lumbar spine.
Background bone marrow signal is within normal limits. Intact
visible sacrum and SI joints.

Conus medullaris and cauda equina: Conus extends to the T12-L1
level. No lower spinal cord or conus signal abnormality.

Paraspinal and other soft tissues: Mild postoperative changes to the
posterior paraspinal soft tissues. Otherwise negative.

Disc levels:

T11-T12 through L1-L2 remain negative.

L2-L3: Disc desiccation and disc space loss with circumferential
disc bulge. Small superimposed left paracentral disc protrusion
(series 18, image 18). Mild posterior element hypertrophy and
epidural lipomatosis. Increased mild spinal stenosis with borderline
to mild bilateral lateral recess stenosis. No convincing foraminal
stenosis.

L3-L4: Increased retrolisthesis. Chronic disc desiccation with
increased disc space loss. Abnormal T2 and STIR hyperintense signal
now within the posterior disc and new caudal extrusion of disc to
the left of midline, into the left lateral recess. See series 10,
image 9 and series 18, image 25. Extruded disc fragment estimated at
10 millimeters diameter.

Superimposed circumferential disc bulge, moderate posterior element
hypertrophy, and epidural lipomatosis.

Increased severe spinal stenosis. Severe left lateral recess
stenosis (left L4 nerve level). Moderate left L3 foraminal stenosis
has also increased since 5038.

L4-L5: Progressed chronic disc space loss, now severe, with
circumferential disc osteophyte complex. Previous right laminectomy
with mild to moderate residual posterior element hypertrophy. No
spinal stenosis. No convincing right lateral recess stenosis. Mild
left lateral recess stenosis is stable (left L5 nerve level). Severe
left L4 foraminal stenosis appears increased on series 10, image 11.
Mild to moderate right L4 foraminal stenosis appears stable.

L5-S1: Chronic disc desiccation with mild progression of
circumferential disc osteophyte complex. Broad-based right
paracentral component appears increased. Postoperative changes to
the right lamina suspected. Mild to moderate residual facet
hypertrophy. No spinal stenosis. Increased mild right greater than
left lateral recess stenosis. Moderate bilateral L5 foraminal
stenosis is stable.
IMPRESSION: 1. Symptomatic level favored to be L3-L4 where mild retrolisthesis
has increased and there is a bulky new leftward caudal disc
extrusion with severe spinal and left lateral recess stenosis. Query
left L4 radiculitis.
Superimposed increased multifactorial severe left L4 neural
foraminal stenosis at L4-L5 (see #2).

2. Progressed chronic disc and endplate degeneration also at the
previously operated L4-L5 and L5-S1 levels. No associated spinal
stenosis, but increased foraminal and/or lateral recess stenosis.

3. Increased mild spinal stenosis at L2-L3.

## 2021-08-16 DIAGNOSIS — H5213 Myopia, bilateral: Secondary | ICD-10-CM | POA: Diagnosis not present

## 2021-08-17 DIAGNOSIS — L821 Other seborrheic keratosis: Secondary | ICD-10-CM | POA: Diagnosis not present

## 2021-08-17 DIAGNOSIS — Z85828 Personal history of other malignant neoplasm of skin: Secondary | ICD-10-CM | POA: Diagnosis not present

## 2021-08-17 DIAGNOSIS — L570 Actinic keratosis: Secondary | ICD-10-CM | POA: Diagnosis not present

## 2021-11-19 DIAGNOSIS — J309 Allergic rhinitis, unspecified: Secondary | ICD-10-CM | POA: Diagnosis not present

## 2021-11-19 DIAGNOSIS — I10 Essential (primary) hypertension: Secondary | ICD-10-CM | POA: Diagnosis not present

## 2021-11-19 DIAGNOSIS — E7800 Pure hypercholesterolemia, unspecified: Secondary | ICD-10-CM | POA: Diagnosis not present

## 2021-11-19 DIAGNOSIS — Z125 Encounter for screening for malignant neoplasm of prostate: Secondary | ICD-10-CM | POA: Diagnosis not present

## 2021-11-19 DIAGNOSIS — Z131 Encounter for screening for diabetes mellitus: Secondary | ICD-10-CM | POA: Diagnosis not present

## 2021-11-19 DIAGNOSIS — Z0000 Encounter for general adult medical examination without abnormal findings: Secondary | ICD-10-CM | POA: Diagnosis not present

## 2021-11-19 DIAGNOSIS — J4520 Mild intermittent asthma, uncomplicated: Secondary | ICD-10-CM | POA: Diagnosis not present

## 2021-12-03 DIAGNOSIS — I10 Essential (primary) hypertension: Secondary | ICD-10-CM | POA: Diagnosis not present

## 2022-08-17 DIAGNOSIS — D2261 Melanocytic nevi of right upper limb, including shoulder: Secondary | ICD-10-CM | POA: Diagnosis not present

## 2022-08-17 DIAGNOSIS — L570 Actinic keratosis: Secondary | ICD-10-CM | POA: Diagnosis not present

## 2022-08-17 DIAGNOSIS — D2262 Melanocytic nevi of left upper limb, including shoulder: Secondary | ICD-10-CM | POA: Diagnosis not present

## 2022-08-17 DIAGNOSIS — L814 Other melanin hyperpigmentation: Secondary | ICD-10-CM | POA: Diagnosis not present

## 2022-08-17 DIAGNOSIS — Z85828 Personal history of other malignant neoplasm of skin: Secondary | ICD-10-CM | POA: Diagnosis not present

## 2022-12-28 DIAGNOSIS — Z125 Encounter for screening for malignant neoplasm of prostate: Secondary | ICD-10-CM | POA: Diagnosis not present

## 2022-12-28 DIAGNOSIS — I10 Essential (primary) hypertension: Secondary | ICD-10-CM | POA: Diagnosis not present

## 2022-12-28 DIAGNOSIS — Z131 Encounter for screening for diabetes mellitus: Secondary | ICD-10-CM | POA: Diagnosis not present

## 2022-12-28 DIAGNOSIS — Z0000 Encounter for general adult medical examination without abnormal findings: Secondary | ICD-10-CM | POA: Diagnosis not present

## 2022-12-28 DIAGNOSIS — E7800 Pure hypercholesterolemia, unspecified: Secondary | ICD-10-CM | POA: Diagnosis not present

## 2022-12-29 DIAGNOSIS — I10 Essential (primary) hypertension: Secondary | ICD-10-CM | POA: Diagnosis not present

## 2022-12-29 DIAGNOSIS — E7800 Pure hypercholesterolemia, unspecified: Secondary | ICD-10-CM | POA: Diagnosis not present

## 2022-12-29 DIAGNOSIS — J4520 Mild intermittent asthma, uncomplicated: Secondary | ICD-10-CM | POA: Diagnosis not present

## 2022-12-29 DIAGNOSIS — Z0000 Encounter for general adult medical examination without abnormal findings: Secondary | ICD-10-CM | POA: Diagnosis not present

## 2022-12-29 DIAGNOSIS — B351 Tinea unguium: Secondary | ICD-10-CM | POA: Diagnosis not present

## 2023-01-20 DIAGNOSIS — B351 Tinea unguium: Secondary | ICD-10-CM | POA: Diagnosis not present

## 2023-05-10 DIAGNOSIS — R5383 Other fatigue: Secondary | ICD-10-CM | POA: Diagnosis not present

## 2023-05-10 DIAGNOSIS — E559 Vitamin D deficiency, unspecified: Secondary | ICD-10-CM | POA: Diagnosis not present

## 2023-05-10 DIAGNOSIS — F5101 Primary insomnia: Secondary | ICD-10-CM | POA: Diagnosis not present

## 2023-05-30 DIAGNOSIS — Z79899 Other long term (current) drug therapy: Secondary | ICD-10-CM | POA: Diagnosis not present

## 2023-07-11 DIAGNOSIS — I10 Essential (primary) hypertension: Secondary | ICD-10-CM | POA: Diagnosis not present

## 2023-07-11 DIAGNOSIS — Z79899 Other long term (current) drug therapy: Secondary | ICD-10-CM | POA: Diagnosis not present

## 2023-07-11 DIAGNOSIS — Z23 Encounter for immunization: Secondary | ICD-10-CM | POA: Diagnosis not present

## 2023-08-17 DIAGNOSIS — H5213 Myopia, bilateral: Secondary | ICD-10-CM | POA: Diagnosis not present

## 2023-08-21 DIAGNOSIS — Z85828 Personal history of other malignant neoplasm of skin: Secondary | ICD-10-CM | POA: Diagnosis not present

## 2023-08-21 DIAGNOSIS — L570 Actinic keratosis: Secondary | ICD-10-CM | POA: Diagnosis not present

## 2023-08-21 DIAGNOSIS — L821 Other seborrheic keratosis: Secondary | ICD-10-CM | POA: Diagnosis not present

## 2023-08-21 DIAGNOSIS — B351 Tinea unguium: Secondary | ICD-10-CM | POA: Diagnosis not present

## 2024-01-23 DIAGNOSIS — Z0000 Encounter for general adult medical examination without abnormal findings: Secondary | ICD-10-CM | POA: Diagnosis not present

## 2024-01-25 DIAGNOSIS — Z0000 Encounter for general adult medical examination without abnormal findings: Secondary | ICD-10-CM | POA: Diagnosis not present

## 2024-01-25 DIAGNOSIS — J4520 Mild intermittent asthma, uncomplicated: Secondary | ICD-10-CM | POA: Diagnosis not present

## 2024-01-25 DIAGNOSIS — E7800 Pure hypercholesterolemia, unspecified: Secondary | ICD-10-CM | POA: Diagnosis not present

## 2024-01-25 DIAGNOSIS — I10 Essential (primary) hypertension: Secondary | ICD-10-CM | POA: Diagnosis not present

## 2024-08-20 DIAGNOSIS — L570 Actinic keratosis: Secondary | ICD-10-CM | POA: Diagnosis not present

## 2024-08-20 DIAGNOSIS — Z85828 Personal history of other malignant neoplasm of skin: Secondary | ICD-10-CM | POA: Diagnosis not present

## 2024-08-20 DIAGNOSIS — L905 Scar conditions and fibrosis of skin: Secondary | ICD-10-CM | POA: Diagnosis not present

## 2024-08-20 DIAGNOSIS — D225 Melanocytic nevi of trunk: Secondary | ICD-10-CM | POA: Diagnosis not present

## 2024-08-20 DIAGNOSIS — L821 Other seborrheic keratosis: Secondary | ICD-10-CM | POA: Diagnosis not present
